# Patient Record
Sex: Female | Born: 1945 | Race: Black or African American | Hispanic: No | Smoking: Former smoker
Health system: Southern US, Community
[De-identification: ages and names within clinical notes are randomized; demographics above are authoritative.]

## PROBLEM LIST (undated history)

## (undated) DIAGNOSIS — I10 Essential (primary) hypertension: Secondary | ICD-10-CM

## (undated) DIAGNOSIS — G8929 Other chronic pain: Secondary | ICD-10-CM

## (undated) DIAGNOSIS — H409 Unspecified glaucoma: Secondary | ICD-10-CM

## (undated) DIAGNOSIS — M7060 Trochanteric bursitis, unspecified hip: Secondary | ICD-10-CM

## (undated) DIAGNOSIS — R519 Headache, unspecified: Secondary | ICD-10-CM

## (undated) DIAGNOSIS — R51 Headache: Secondary | ICD-10-CM

## (undated) DIAGNOSIS — K219 Gastro-esophageal reflux disease without esophagitis: Secondary | ICD-10-CM

## (undated) HISTORY — DX: Essential (primary) hypertension: I10

## (undated) HISTORY — DX: Trochanteric bursitis, unspecified hip: M70.60

## (undated) HISTORY — DX: Unspecified glaucoma: H40.9

## (undated) HISTORY — DX: Headache: R51

## (undated) HISTORY — DX: Gastro-esophageal reflux disease without esophagitis: K21.9

## (undated) HISTORY — DX: Other chronic pain: G89.29

## (undated) HISTORY — DX: Headache, unspecified: R51.9

---

## 2012-12-03 DIAGNOSIS — R079 Chest pain, unspecified: Secondary | ICD-10-CM

## 2013-03-02 ENCOUNTER — Other Ambulatory Visit: Payer: Self-pay | Admitting: *Deleted

## 2013-03-02 DIAGNOSIS — R609 Edema, unspecified: Secondary | ICD-10-CM

## 2013-03-03 ENCOUNTER — Encounter: Payer: Self-pay | Admitting: Vascular Surgery

## 2013-04-22 ENCOUNTER — Encounter: Payer: Self-pay | Admitting: Vascular Surgery

## 2013-04-23 ENCOUNTER — Encounter: Payer: Self-pay | Admitting: Vascular Surgery

## 2013-04-23 ENCOUNTER — Ambulatory Visit (INDEPENDENT_AMBULATORY_CARE_PROVIDER_SITE_OTHER): Payer: PRIVATE HEALTH INSURANCE | Admitting: Vascular Surgery

## 2013-04-23 ENCOUNTER — Encounter (INDEPENDENT_AMBULATORY_CARE_PROVIDER_SITE_OTHER): Payer: PRIVATE HEALTH INSURANCE | Admitting: *Deleted

## 2013-04-23 VITALS — BP 162/80 | HR 68 | Resp 16 | Ht 72.0 in | Wt 202.0 lb

## 2013-04-23 DIAGNOSIS — R609 Edema, unspecified: Secondary | ICD-10-CM

## 2013-04-23 DIAGNOSIS — I872 Venous insufficiency (chronic) (peripheral): Secondary | ICD-10-CM

## 2013-04-23 NOTE — Progress Notes (Signed)
[facility] [place]  Referred by:  Dr. Sherryll Burger  Reason for referral: Swollen bilateral legs  History of Present Illness  Michaela Moore is a 67 y.o. (10-05-1946) female who presents with chief complaint: swollen leg.  Patient notes, onset of swelling 3 months ago, associated with no obvious trigger.  Upon further questioning, pt does not prior swelling years ago.  The patient's symptoms include: bilateral leg swelling with itching.  The patient has had no history of DVT, history of pregnancy, known history of varicose vein, no history of venous stasis ulcers, no history of Lymphedema and known history of skin changes in lower legs.  There is known family history of venous disorders.  The patient has used OTC compression stockings in the past.  Past Medical History  Diagnosis Date  . Hypertension   . Esophageal reflux   . Chronic headaches   . Trochanteric tendinitis   . Glaucoma    PSH Denies any prior procedure  History   Social History  . Marital Status: Widowed    Spouse Name: N/A    Number of Children: N/A  . Years of Education: N/A   Occupational History  . Not on file.   Social History Main Topics  . Smoking status: Former Smoker    Quit date: 10/08/1987  . Smokeless tobacco: Never Used  . Alcohol Use: No  . Drug Use: No  . Sexually Active: Not on file   Other Topics Concern  . Not on file   Social History Narrative  . No narrative on file   Family History  Problem Relation Age of Onset  . Cancer Father   . Hyperlipidemia Sister   . Hypertension Sister   . Heart attack Brother   . COPD Sister   . Hypertension Sister    Current Outpatient Prescriptions on File Prior to Visit  Medication Sig Dispense Refill  . amLODipine (NORVASC) 10 MG tablet Take 10 mg by mouth daily.      Marland Kitchen aspirin 81 MG tablet Take 81 mg by mouth daily.      . benazepril (LOTENSIN) 20 MG tablet Take 20 mg by mouth daily.      . cyclobenzaprine (FLEXERIL) 10 MG  tablet Take 10 mg by mouth 3 (three) times daily as needed for muscle spasms.      . calcium carbonate (TUMS - DOSED IN MG ELEMENTAL CALCIUM) 500 MG chewable tablet Chew 1 tablet by mouth daily.      . Cholecalciferol (VITAMIN D) 400 UNITS capsule Take 400 Units by mouth daily.      . fexofenadine (ALLEGRA) 180 MG tablet Take 180 mg by mouth daily.      . vitamin C (ASCORBIC ACID) 500 MG tablet Take 500 mg by mouth daily.       No current facility-administered medications on file prior to visit.   Allergies  Allergen Reactions  . Codeine Nausea Only    REVIEW OF SYSTEMS:  (Positives checked otherwise negative)  CARDIOVASCULAR:  []  chest pain, []  chest pressure, []  palpitations, []  shortness of breath when laying flat, []  shortness of breath with exertion,  []  pain in feet when walking, []  pain in feet when laying flat, []  history of blood clot in veins (DVT), []  history of phlebitis, [x]  swelling in legs, [x]  varicose veins  PULMONARY:  []  productive cough, []  asthma, []  wheezing  NEUROLOGIC:  []  weakness in arms or legs, []  numbness in arms or legs, []  difficulty speaking or  slurred speech, []  temporary loss of vision in one eye, []  dizziness  HEMATOLOGIC:  []  bleeding problems, []  problems with blood clotting too easily  MUSCULOSKEL:  []  joint pain, []  joint swelling  GASTROINTEST:  []  vomiting blood, []  blood in stool     GENITOURINARY:  []  burning with urination, []  blood in urine  PSYCHIATRIC:  []  history of major depression  INTEGUMENTARY:  []  rashes, []  ulcers  CONSTITUTIONAL:  []  fever, []  chills  For VQI Use Only  PRE-ADM LIVING: Home  AMB STATUS: Ambulatory  CAD Sx: None  PRIOR CHF: None  STRESS TEST: [x]  No, [ ]  Normal, [ ]  + ischemia, [ ]  + MI, [ ]  Both  Physical Examination Filed Vitals:   04/23/13 1011  BP: 162/80  Pulse: 68  Resp: 16  Height: 6' (1.829 m)  Weight: 202 lb (91.627 kg)  SpO2: 99%   Body mass index is 27.39 kg/(m^2).  General: A&O  x 3, WDWN  Head: [place]/AT  Ear/Nose/Throat: Hearing grossly intact, nares w/o erythema or drainage, oropharynx w/o Erythema/Exudate  Eyes: PERRLA, EOMI  Neck: Supple, no nuchal rigidity, no palpable LAD  Pulmonary: Sym exp, good air movt, CTAB, no rales, rhonchi, & wheezing  Cardiac: RRR, Nl S1, S2, no Murmurs, rubs or gallops  Vascular: Vessel Right Left  Radial Palpable Palpable  Brachial Palpable Palpable  Carotid Palpable, without bruit Palpable, without bruit  Aorta Not palpable N/A  Femoral Palpable Palpable  Popliteal Not palpable Not palpable  PT Palpable Palpable  DP Palpable Palpable   Gastrointestinal: soft, NTND, -G/R, - HSM, - masses, - CVAT B  Musculoskeletal: M/S 5/5 throughout , Extremities without ischemic changes   Neurologic: CN 2-12 intact , Pain and light touch intact in extremities , Motor exam as listed above  Psychiatric: Judgment intact, Mood & affect appropriate for pt's clinical situation  Dermatologic: See M/S exam for extremity exam, no rashes otherwise noted  Lymph : No Cervical, Axillary, or Inguinal lymphadenopathy   Non-Invasive Vascular Imaging  BLE Venous Insufficiency Duplex (Date: 04/23/2013):   RLE: no DVT and SVT, no GSV reflux, + deep venous reflux, +R SSV reflux  LLE: no DVT and SVT, no GSV reflux, + deep venous reflux  Outside Studies/Documentation 4 pages of outside documents were reviewed including: clinic chart.  Medical Decision Making  Michaela Moore is a 67 y.o. female who presents with: chronic venous insufficiency (C2).   Based on the patient's history and examination, I recommend: B thigh high compression stockings 20-30 mm Hg.  The patient will follow up as needed for additional evaluation.  Thank you for allowing [place] to participate in this patient's care.  Leonides Sake, MD [facility] [place] Office: [phone] Pager: [phone]  04/23/2013, 10:53 AM

## 2015-10-23 DIAGNOSIS — Z6830 Body mass index (BMI) 30.0-30.9, adult: Secondary | ICD-10-CM | POA: Diagnosis not present

## 2015-10-23 DIAGNOSIS — R51 Headache: Secondary | ICD-10-CM | POA: Diagnosis not present

## 2015-10-23 DIAGNOSIS — Z789 Other specified health status: Secondary | ICD-10-CM | POA: Diagnosis not present

## 2015-11-21 DIAGNOSIS — I10 Essential (primary) hypertension: Secondary | ICD-10-CM | POA: Diagnosis not present

## 2016-05-27 DIAGNOSIS — Z79899 Other long term (current) drug therapy: Secondary | ICD-10-CM | POA: Diagnosis not present

## 2016-05-27 DIAGNOSIS — Z299 Encounter for prophylactic measures, unspecified: Secondary | ICD-10-CM | POA: Diagnosis not present

## 2016-05-27 DIAGNOSIS — Z0000 Encounter for general adult medical examination without abnormal findings: Secondary | ICD-10-CM | POA: Diagnosis not present

## 2016-05-27 DIAGNOSIS — E559 Vitamin D deficiency, unspecified: Secondary | ICD-10-CM | POA: Diagnosis not present

## 2016-05-27 DIAGNOSIS — I10 Essential (primary) hypertension: Secondary | ICD-10-CM | POA: Diagnosis not present

## 2016-05-27 DIAGNOSIS — R5383 Other fatigue: Secondary | ICD-10-CM | POA: Diagnosis not present

## 2016-05-27 DIAGNOSIS — Z01419 Encounter for gynecological examination (general) (routine) without abnormal findings: Secondary | ICD-10-CM | POA: Diagnosis not present

## 2016-05-27 DIAGNOSIS — Z1389 Encounter for screening for other disorder: Secondary | ICD-10-CM | POA: Diagnosis not present

## 2016-05-27 DIAGNOSIS — Z1211 Encounter for screening for malignant neoplasm of colon: Secondary | ICD-10-CM | POA: Diagnosis not present

## 2016-05-27 DIAGNOSIS — Z7189 Other specified counseling: Secondary | ICD-10-CM | POA: Diagnosis not present

## 2016-07-17 DIAGNOSIS — S161XXA Strain of muscle, fascia and tendon at neck level, initial encounter: Secondary | ICD-10-CM | POA: Diagnosis not present

## 2016-07-19 ENCOUNTER — Emergency Department (HOSPITAL_COMMUNITY)
Admission: EM | Admit: 2016-07-19 | Discharge: 2016-07-19 | Disposition: A | Discharge status: Home / Self Care | Payer: Medicare Other | Attending: Emergency Medicine | Admitting: Emergency Medicine

## 2016-07-19 ENCOUNTER — Encounter (HOSPITAL_COMMUNITY): Payer: Self-pay | Admitting: Emergency Medicine

## 2016-07-19 DIAGNOSIS — Z299 Encounter for prophylactic measures, unspecified: Secondary | ICD-10-CM | POA: Diagnosis not present

## 2016-07-19 DIAGNOSIS — S060X0D Concussion without loss of consciousness, subsequent encounter: Secondary | ICD-10-CM

## 2016-07-19 DIAGNOSIS — Z87891 Personal history of nicotine dependence: Secondary | ICD-10-CM | POA: Insufficient documentation

## 2016-07-19 DIAGNOSIS — Z79899 Other long term (current) drug therapy: Secondary | ICD-10-CM | POA: Diagnosis not present

## 2016-07-19 DIAGNOSIS — Z7982 Long term (current) use of aspirin: Secondary | ICD-10-CM | POA: Diagnosis not present

## 2016-07-19 DIAGNOSIS — Z6829 Body mass index (BMI) 29.0-29.9, adult: Secondary | ICD-10-CM | POA: Diagnosis not present

## 2016-07-19 DIAGNOSIS — S0990XD Unspecified injury of head, subsequent encounter: Secondary | ICD-10-CM | POA: Diagnosis present

## 2016-07-19 DIAGNOSIS — I10 Essential (primary) hypertension: Secondary | ICD-10-CM | POA: Insufficient documentation

## 2016-07-19 DIAGNOSIS — M791 Myalgia: Secondary | ICD-10-CM | POA: Diagnosis not present

## 2016-07-19 DIAGNOSIS — R51 Headache: Secondary | ICD-10-CM | POA: Diagnosis not present

## 2016-07-19 DIAGNOSIS — Z713 Dietary counseling and surveillance: Secondary | ICD-10-CM | POA: Diagnosis not present
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
## 2016-09-09 ENCOUNTER — Other Ambulatory Visit (HOSPITAL_COMMUNITY): Payer: Self-pay | Admitting: Nurse Practitioner

## 2016-09-09 ENCOUNTER — Ambulatory Visit (HOSPITAL_COMMUNITY)
Admission: RE | Admit: 2016-09-09 | Discharge: 2016-09-09 | Disposition: A | Discharge status: Home / Self Care | Payer: Medicare Other | Source: Ambulatory Visit | Attending: Nurse Practitioner | Admitting: Nurse Practitioner

## 2016-09-09 DIAGNOSIS — M7122 Synovial cyst of popliteal space [Baker], left knee: Secondary | ICD-10-CM | POA: Diagnosis not present

## 2016-09-09 DIAGNOSIS — M79605 Pain in left leg: Secondary | ICD-10-CM

## 2016-09-09 DIAGNOSIS — R600 Localized edema: Secondary | ICD-10-CM | POA: Diagnosis not present

## 2016-09-09 DIAGNOSIS — M7989 Other specified soft tissue disorders: Secondary | ICD-10-CM | POA: Insufficient documentation

## 2016-09-09 DIAGNOSIS — Z299 Encounter for prophylactic measures, unspecified: Secondary | ICD-10-CM | POA: Diagnosis not present

## 2016-09-09 DIAGNOSIS — Z713 Dietary counseling and surveillance: Secondary | ICD-10-CM | POA: Diagnosis not present

## 2016-09-09 DIAGNOSIS — Z6830 Body mass index (BMI) 30.0-30.9, adult: Secondary | ICD-10-CM | POA: Diagnosis not present

## 2016-09-09 DIAGNOSIS — M79662 Pain in left lower leg: Secondary | ICD-10-CM | POA: Diagnosis not present

## 2016-09-09 DIAGNOSIS — I10 Essential (primary) hypertension: Secondary | ICD-10-CM | POA: Diagnosis not present

## 2016-11-15 DIAGNOSIS — R040 Epistaxis: Secondary | ICD-10-CM | POA: Diagnosis not present

## 2016-11-15 DIAGNOSIS — Z6829 Body mass index (BMI) 29.0-29.9, adult: Secondary | ICD-10-CM | POA: Diagnosis not present

## 2016-11-15 DIAGNOSIS — Z789 Other specified health status: Secondary | ICD-10-CM | POA: Diagnosis not present

## 2016-11-15 DIAGNOSIS — K219 Gastro-esophageal reflux disease without esophagitis: Secondary | ICD-10-CM | POA: Diagnosis not present

## 2016-11-15 DIAGNOSIS — Z713 Dietary counseling and surveillance: Secondary | ICD-10-CM | POA: Diagnosis not present

## 2016-11-15 DIAGNOSIS — I10 Essential (primary) hypertension: Secondary | ICD-10-CM | POA: Diagnosis not present

## 2016-11-15 DIAGNOSIS — B354 Tinea corporis: Secondary | ICD-10-CM | POA: Diagnosis not present

## 2016-11-15 DIAGNOSIS — Z299 Encounter for prophylactic measures, unspecified: Secondary | ICD-10-CM | POA: Diagnosis not present

## 2016-12-05 DIAGNOSIS — K219 Gastro-esophageal reflux disease without esophagitis: Secondary | ICD-10-CM | POA: Diagnosis not present

## 2016-12-05 DIAGNOSIS — Z6830 Body mass index (BMI) 30.0-30.9, adult: Secondary | ICD-10-CM | POA: Diagnosis not present

## 2016-12-05 DIAGNOSIS — I10 Essential (primary) hypertension: Secondary | ICD-10-CM | POA: Diagnosis not present

## 2016-12-05 DIAGNOSIS — Z713 Dietary counseling and surveillance: Secondary | ICD-10-CM | POA: Diagnosis not present

## 2016-12-05 DIAGNOSIS — R51 Headache: Secondary | ICD-10-CM | POA: Diagnosis not present

## 2016-12-05 DIAGNOSIS — Z299 Encounter for prophylactic measures, unspecified: Secondary | ICD-10-CM | POA: Diagnosis not present

## 2017-03-11 DIAGNOSIS — Z972 Presence of dental prosthetic device (complete) (partial): Secondary | ICD-10-CM | POA: Diagnosis not present

## 2017-03-11 DIAGNOSIS — Z79899 Other long term (current) drug therapy: Secondary | ICD-10-CM | POA: Diagnosis not present

## 2017-03-11 DIAGNOSIS — Z7982 Long term (current) use of aspirin: Secondary | ICD-10-CM | POA: Diagnosis not present

## 2017-03-11 DIAGNOSIS — K08409 Partial loss of teeth, unspecified cause, unspecified class: Secondary | ICD-10-CM | POA: Diagnosis not present

## 2017-03-11 DIAGNOSIS — S161XXD Strain of muscle, fascia and tendon at neck level, subsequent encounter: Secondary | ICD-10-CM | POA: Diagnosis not present

## 2017-03-11 DIAGNOSIS — I10 Essential (primary) hypertension: Secondary | ICD-10-CM | POA: Diagnosis not present

## 2017-03-11 DIAGNOSIS — R600 Localized edema: Secondary | ICD-10-CM | POA: Diagnosis not present

## 2017-03-11 DIAGNOSIS — Z6829 Body mass index (BMI) 29.0-29.9, adult: Secondary | ICD-10-CM | POA: Diagnosis not present

## 2017-03-11 DIAGNOSIS — Z0000 Encounter for general adult medical examination without abnormal findings: Secondary | ICD-10-CM | POA: Diagnosis not present

## 2017-03-21 DIAGNOSIS — I10 Essential (primary) hypertension: Secondary | ICD-10-CM | POA: Diagnosis not present

## 2017-03-21 DIAGNOSIS — M549 Dorsalgia, unspecified: Secondary | ICD-10-CM | POA: Diagnosis not present

## 2017-03-21 DIAGNOSIS — Z713 Dietary counseling and surveillance: Secondary | ICD-10-CM | POA: Diagnosis not present

## 2017-03-21 DIAGNOSIS — K219 Gastro-esophageal reflux disease without esophagitis: Secondary | ICD-10-CM | POA: Diagnosis not present

## 2017-03-21 DIAGNOSIS — Z299 Encounter for prophylactic measures, unspecified: Secondary | ICD-10-CM | POA: Diagnosis not present

## 2017-03-21 DIAGNOSIS — Z6829 Body mass index (BMI) 29.0-29.9, adult: Secondary | ICD-10-CM | POA: Diagnosis not present

## 2017-06-02 DIAGNOSIS — Z79899 Other long term (current) drug therapy: Secondary | ICD-10-CM | POA: Diagnosis not present

## 2017-06-02 DIAGNOSIS — Z1389 Encounter for screening for other disorder: Secondary | ICD-10-CM | POA: Diagnosis not present

## 2017-06-02 DIAGNOSIS — Z7189 Other specified counseling: Secondary | ICD-10-CM | POA: Diagnosis not present

## 2017-06-02 DIAGNOSIS — Z1211 Encounter for screening for malignant neoplasm of colon: Secondary | ICD-10-CM | POA: Diagnosis not present

## 2017-06-02 DIAGNOSIS — Z0000 Encounter for general adult medical examination without abnormal findings: Secondary | ICD-10-CM | POA: Diagnosis not present

## 2017-06-02 DIAGNOSIS — R011 Cardiac murmur, unspecified: Secondary | ICD-10-CM | POA: Diagnosis not present

## 2017-06-02 DIAGNOSIS — Z299 Encounter for prophylactic measures, unspecified: Secondary | ICD-10-CM | POA: Diagnosis not present

## 2017-06-02 DIAGNOSIS — Z6830 Body mass index (BMI) 30.0-30.9, adult: Secondary | ICD-10-CM | POA: Diagnosis not present

## 2017-06-02 DIAGNOSIS — E559 Vitamin D deficiency, unspecified: Secondary | ICD-10-CM | POA: Diagnosis not present

## 2017-06-02 DIAGNOSIS — Z1231 Encounter for screening mammogram for malignant neoplasm of breast: Secondary | ICD-10-CM | POA: Diagnosis not present

## 2017-06-03 DIAGNOSIS — I10 Essential (primary) hypertension: Secondary | ICD-10-CM | POA: Diagnosis not present

## 2017-06-03 DIAGNOSIS — Z79899 Other long term (current) drug therapy: Secondary | ICD-10-CM | POA: Diagnosis not present

## 2017-06-03 DIAGNOSIS — R5383 Other fatigue: Secondary | ICD-10-CM | POA: Diagnosis not present

## 2017-06-03 DIAGNOSIS — Z0000 Encounter for general adult medical examination without abnormal findings: Secondary | ICD-10-CM | POA: Diagnosis not present

## 2017-06-03 DIAGNOSIS — E559 Vitamin D deficiency, unspecified: Secondary | ICD-10-CM | POA: Diagnosis not present

## 2017-06-16 DIAGNOSIS — R010 Benign and innocent cardiac murmurs: Secondary | ICD-10-CM | POA: Diagnosis not present

## 2017-06-20 DIAGNOSIS — Z299 Encounter for prophylactic measures, unspecified: Secondary | ICD-10-CM | POA: Diagnosis not present

## 2017-06-20 DIAGNOSIS — Z6830 Body mass index (BMI) 30.0-30.9, adult: Secondary | ICD-10-CM | POA: Diagnosis not present

## 2017-06-20 DIAGNOSIS — I10 Essential (primary) hypertension: Secondary | ICD-10-CM | POA: Diagnosis not present

## 2017-06-20 DIAGNOSIS — K219 Gastro-esophageal reflux disease without esophagitis: Secondary | ICD-10-CM | POA: Diagnosis not present

## 2017-06-20 DIAGNOSIS — R0789 Other chest pain: Secondary | ICD-10-CM | POA: Diagnosis not present

## 2017-06-20 DIAGNOSIS — M940 Chondrocostal junction syndrome [Tietze]: Secondary | ICD-10-CM | POA: Diagnosis not present

## 2017-06-24 DIAGNOSIS — E2839 Other primary ovarian failure: Secondary | ICD-10-CM | POA: Diagnosis not present

## 2017-07-29 DIAGNOSIS — Z1231 Encounter for screening mammogram for malignant neoplasm of breast: Secondary | ICD-10-CM | POA: Diagnosis not present

## 2017-11-04 DIAGNOSIS — Z789 Other specified health status: Secondary | ICD-10-CM | POA: Diagnosis not present

## 2017-11-04 DIAGNOSIS — M1711 Unilateral primary osteoarthritis, right knee: Secondary | ICD-10-CM | POA: Diagnosis not present

## 2017-11-04 DIAGNOSIS — Z299 Encounter for prophylactic measures, unspecified: Secondary | ICD-10-CM | POA: Diagnosis not present

## 2017-11-04 DIAGNOSIS — M25561 Pain in right knee: Secondary | ICD-10-CM | POA: Diagnosis not present

## 2017-11-04 DIAGNOSIS — Z6832 Body mass index (BMI) 32.0-32.9, adult: Secondary | ICD-10-CM | POA: Diagnosis not present

## 2017-11-04 DIAGNOSIS — I10 Essential (primary) hypertension: Secondary | ICD-10-CM | POA: Diagnosis not present

## 2018-01-26 DIAGNOSIS — Z791 Long term (current) use of non-steroidal anti-inflammatories (NSAID): Secondary | ICD-10-CM | POA: Diagnosis not present

## 2018-01-26 DIAGNOSIS — R69 Illness, unspecified: Secondary | ICD-10-CM | POA: Diagnosis not present

## 2018-01-26 DIAGNOSIS — J309 Allergic rhinitis, unspecified: Secondary | ICD-10-CM | POA: Diagnosis not present

## 2018-01-26 DIAGNOSIS — G8929 Other chronic pain: Secondary | ICD-10-CM | POA: Diagnosis not present

## 2018-01-26 DIAGNOSIS — E669 Obesity, unspecified: Secondary | ICD-10-CM | POA: Diagnosis not present

## 2018-01-26 DIAGNOSIS — Z6830 Body mass index (BMI) 30.0-30.9, adult: Secondary | ICD-10-CM | POA: Diagnosis not present

## 2018-01-26 DIAGNOSIS — H409 Unspecified glaucoma: Secondary | ICD-10-CM | POA: Diagnosis not present

## 2018-01-26 DIAGNOSIS — H269 Unspecified cataract: Secondary | ICD-10-CM | POA: Diagnosis not present

## 2018-01-26 DIAGNOSIS — I10 Essential (primary) hypertension: Secondary | ICD-10-CM | POA: Diagnosis not present

## 2018-01-26 DIAGNOSIS — K08409 Partial loss of teeth, unspecified cause, unspecified class: Secondary | ICD-10-CM | POA: Diagnosis not present

## 2018-02-19 DIAGNOSIS — H35463 Secondary vitreoretinal degeneration, bilateral: Secondary | ICD-10-CM | POA: Diagnosis not present

## 2018-02-19 DIAGNOSIS — H524 Presbyopia: Secondary | ICD-10-CM | POA: Diagnosis not present

## 2018-03-12 DIAGNOSIS — Z6831 Body mass index (BMI) 31.0-31.9, adult: Secondary | ICD-10-CM | POA: Diagnosis not present

## 2018-03-12 DIAGNOSIS — Z299 Encounter for prophylactic measures, unspecified: Secondary | ICD-10-CM | POA: Diagnosis not present

## 2018-03-12 DIAGNOSIS — M7040 Prepatellar bursitis, unspecified knee: Secondary | ICD-10-CM | POA: Diagnosis not present

## 2018-03-12 DIAGNOSIS — I10 Essential (primary) hypertension: Secondary | ICD-10-CM | POA: Diagnosis not present

## 2018-03-12 DIAGNOSIS — M1710 Unilateral primary osteoarthritis, unspecified knee: Secondary | ICD-10-CM | POA: Diagnosis not present

## 2018-04-03 DIAGNOSIS — H25013 Cortical age-related cataract, bilateral: Secondary | ICD-10-CM | POA: Diagnosis not present

## 2018-04-03 DIAGNOSIS — H2513 Age-related nuclear cataract, bilateral: Secondary | ICD-10-CM | POA: Diagnosis not present

## 2018-04-03 DIAGNOSIS — H401131 Primary open-angle glaucoma, bilateral, mild stage: Secondary | ICD-10-CM | POA: Diagnosis not present

## 2018-04-03 DIAGNOSIS — H5010 Unspecified exotropia: Secondary | ICD-10-CM | POA: Diagnosis not present

## 2018-05-11 DIAGNOSIS — H401131 Primary open-angle glaucoma, bilateral, mild stage: Secondary | ICD-10-CM | POA: Diagnosis not present

## 2018-06-04 DIAGNOSIS — R5383 Other fatigue: Secondary | ICD-10-CM | POA: Diagnosis not present

## 2018-06-04 DIAGNOSIS — Z6832 Body mass index (BMI) 32.0-32.9, adult: Secondary | ICD-10-CM | POA: Diagnosis not present

## 2018-06-04 DIAGNOSIS — E559 Vitamin D deficiency, unspecified: Secondary | ICD-10-CM | POA: Diagnosis not present

## 2018-06-04 DIAGNOSIS — Z299 Encounter for prophylactic measures, unspecified: Secondary | ICD-10-CM | POA: Diagnosis not present

## 2018-06-04 DIAGNOSIS — Z1331 Encounter for screening for depression: Secondary | ICD-10-CM | POA: Diagnosis not present

## 2018-06-04 DIAGNOSIS — Z0000 Encounter for general adult medical examination without abnormal findings: Secondary | ICD-10-CM | POA: Diagnosis not present

## 2018-06-04 DIAGNOSIS — I10 Essential (primary) hypertension: Secondary | ICD-10-CM | POA: Diagnosis not present

## 2018-06-04 DIAGNOSIS — Z7189 Other specified counseling: Secondary | ICD-10-CM | POA: Diagnosis not present

## 2018-06-04 DIAGNOSIS — Z79899 Other long term (current) drug therapy: Secondary | ICD-10-CM | POA: Diagnosis not present

## 2018-06-04 DIAGNOSIS — Z1339 Encounter for screening examination for other mental health and behavioral disorders: Secondary | ICD-10-CM | POA: Diagnosis not present

## 2018-06-04 DIAGNOSIS — Z1211 Encounter for screening for malignant neoplasm of colon: Secondary | ICD-10-CM | POA: Diagnosis not present

## 2018-08-24 DIAGNOSIS — J029 Acute pharyngitis, unspecified: Secondary | ICD-10-CM | POA: Diagnosis not present

## 2018-08-24 DIAGNOSIS — Z299 Encounter for prophylactic measures, unspecified: Secondary | ICD-10-CM | POA: Diagnosis not present

## 2018-08-24 DIAGNOSIS — Z6831 Body mass index (BMI) 31.0-31.9, adult: Secondary | ICD-10-CM | POA: Diagnosis not present

## 2018-08-24 DIAGNOSIS — K219 Gastro-esophageal reflux disease without esophagitis: Secondary | ICD-10-CM | POA: Diagnosis not present

## 2018-08-24 DIAGNOSIS — I10 Essential (primary) hypertension: Secondary | ICD-10-CM | POA: Diagnosis not present

## 2018-09-22 DIAGNOSIS — H401131 Primary open-angle glaucoma, bilateral, mild stage: Secondary | ICD-10-CM | POA: Diagnosis not present

## 2018-11-23 DIAGNOSIS — I10 Essential (primary) hypertension: Secondary | ICD-10-CM | POA: Diagnosis not present

## 2018-11-23 DIAGNOSIS — R6889 Other general symptoms and signs: Secondary | ICD-10-CM | POA: Diagnosis not present

## 2018-11-23 DIAGNOSIS — Z87891 Personal history of nicotine dependence: Secondary | ICD-10-CM | POA: Diagnosis not present

## 2018-11-23 DIAGNOSIS — Z299 Encounter for prophylactic measures, unspecified: Secondary | ICD-10-CM | POA: Diagnosis not present

## 2018-11-23 DIAGNOSIS — Z6832 Body mass index (BMI) 32.0-32.9, adult: Secondary | ICD-10-CM | POA: Diagnosis not present

## 2018-11-23 DIAGNOSIS — B354 Tinea corporis: Secondary | ICD-10-CM | POA: Diagnosis not present

## 2018-12-08 DIAGNOSIS — Z6832 Body mass index (BMI) 32.0-32.9, adult: Secondary | ICD-10-CM | POA: Diagnosis not present

## 2018-12-08 DIAGNOSIS — Z299 Encounter for prophylactic measures, unspecified: Secondary | ICD-10-CM | POA: Diagnosis not present

## 2018-12-08 DIAGNOSIS — M25579 Pain in unspecified ankle and joints of unspecified foot: Secondary | ICD-10-CM | POA: Diagnosis not present

## 2018-12-08 DIAGNOSIS — I10 Essential (primary) hypertension: Secondary | ICD-10-CM | POA: Diagnosis not present

## 2019-05-25 DIAGNOSIS — H401131 Primary open-angle glaucoma, bilateral, mild stage: Secondary | ICD-10-CM | POA: Diagnosis not present

## 2019-05-25 DIAGNOSIS — H469 Unspecified optic neuritis: Secondary | ICD-10-CM | POA: Diagnosis not present

## 2019-05-25 DIAGNOSIS — H2513 Age-related nuclear cataract, bilateral: Secondary | ICD-10-CM | POA: Diagnosis not present

## 2019-05-25 DIAGNOSIS — H25013 Cortical age-related cataract, bilateral: Secondary | ICD-10-CM | POA: Diagnosis not present

## 2019-06-10 DIAGNOSIS — Z6831 Body mass index (BMI) 31.0-31.9, adult: Secondary | ICD-10-CM | POA: Diagnosis not present

## 2019-06-10 DIAGNOSIS — M1710 Unilateral primary osteoarthritis, unspecified knee: Secondary | ICD-10-CM | POA: Diagnosis not present

## 2019-06-10 DIAGNOSIS — E559 Vitamin D deficiency, unspecified: Secondary | ICD-10-CM | POA: Diagnosis not present

## 2019-06-10 DIAGNOSIS — I10 Essential (primary) hypertension: Secondary | ICD-10-CM | POA: Diagnosis not present

## 2019-06-10 DIAGNOSIS — R5383 Other fatigue: Secondary | ICD-10-CM | POA: Diagnosis not present

## 2019-06-10 DIAGNOSIS — Z299 Encounter for prophylactic measures, unspecified: Secondary | ICD-10-CM | POA: Diagnosis not present

## 2019-06-10 DIAGNOSIS — Z79899 Other long term (current) drug therapy: Secondary | ICD-10-CM | POA: Diagnosis not present

## 2019-06-10 DIAGNOSIS — Z789 Other specified health status: Secondary | ICD-10-CM | POA: Diagnosis not present

## 2019-06-11 DIAGNOSIS — Z299 Encounter for prophylactic measures, unspecified: Secondary | ICD-10-CM | POA: Diagnosis not present

## 2019-06-11 DIAGNOSIS — Z1211 Encounter for screening for malignant neoplasm of colon: Secondary | ICD-10-CM | POA: Diagnosis not present

## 2019-06-11 DIAGNOSIS — Z1339 Encounter for screening examination for other mental health and behavioral disorders: Secondary | ICD-10-CM | POA: Diagnosis not present

## 2019-06-11 DIAGNOSIS — I10 Essential (primary) hypertension: Secondary | ICD-10-CM | POA: Diagnosis not present

## 2019-06-11 DIAGNOSIS — M1710 Unilateral primary osteoarthritis, unspecified knee: Secondary | ICD-10-CM | POA: Diagnosis not present

## 2019-06-11 DIAGNOSIS — Z6831 Body mass index (BMI) 31.0-31.9, adult: Secondary | ICD-10-CM | POA: Diagnosis not present

## 2019-06-11 DIAGNOSIS — Z7189 Other specified counseling: Secondary | ICD-10-CM | POA: Diagnosis not present

## 2019-06-11 DIAGNOSIS — Z1331 Encounter for screening for depression: Secondary | ICD-10-CM | POA: Diagnosis not present

## 2019-06-11 DIAGNOSIS — E8940 Asymptomatic postprocedural ovarian failure: Secondary | ICD-10-CM | POA: Diagnosis not present

## 2019-06-11 DIAGNOSIS — Z0000 Encounter for general adult medical examination without abnormal findings: Secondary | ICD-10-CM | POA: Diagnosis not present

## 2019-12-10 DIAGNOSIS — Z299 Encounter for prophylactic measures, unspecified: Secondary | ICD-10-CM | POA: Diagnosis not present

## 2019-12-10 DIAGNOSIS — Z6831 Body mass index (BMI) 31.0-31.9, adult: Secondary | ICD-10-CM | POA: Diagnosis not present

## 2019-12-10 DIAGNOSIS — K219 Gastro-esophageal reflux disease without esophagitis: Secondary | ICD-10-CM | POA: Diagnosis not present

## 2019-12-10 DIAGNOSIS — I10 Essential (primary) hypertension: Secondary | ICD-10-CM | POA: Diagnosis not present

## 2019-12-10 DIAGNOSIS — Z789 Other specified health status: Secondary | ICD-10-CM | POA: Diagnosis not present

## 2019-12-29 DIAGNOSIS — H53031 Strabismic amblyopia, right eye: Secondary | ICD-10-CM | POA: Diagnosis not present

## 2019-12-29 DIAGNOSIS — H401131 Primary open-angle glaucoma, bilateral, mild stage: Secondary | ICD-10-CM | POA: Diagnosis not present

## 2019-12-29 DIAGNOSIS — H469 Unspecified optic neuritis: Secondary | ICD-10-CM | POA: Diagnosis not present

## 2019-12-30 ENCOUNTER — Other Ambulatory Visit: Payer: Self-pay | Admitting: Surgery

## 2019-12-30 DIAGNOSIS — H469 Unspecified optic neuritis: Secondary | ICD-10-CM

## 2019-12-31 ENCOUNTER — Other Ambulatory Visit: Payer: Self-pay | Admitting: Surgery

## 2019-12-31 DIAGNOSIS — H469 Unspecified optic neuritis: Secondary | ICD-10-CM

## 2020-01-27 ENCOUNTER — Ambulatory Visit
Admission: RE | Admit: 2020-01-27 | Discharge: 2020-01-27 | Disposition: A | Discharge status: Home / Self Care | Payer: Medicare Other | Source: Ambulatory Visit | Attending: Surgery | Admitting: Surgery

## 2020-01-27 ENCOUNTER — Other Ambulatory Visit: Payer: Self-pay

## 2020-01-27 DIAGNOSIS — H469 Unspecified optic neuritis: Secondary | ICD-10-CM | POA: Diagnosis not present

## 2020-01-27 MED ORDER — GADOBENATE DIMEGLUMINE 529 MG/ML IV SOLN
20.0000 mL | Freq: Once | INTRAVENOUS | Status: AC | PRN
  Administered 2020-01-27: 20 mL via INTRAVENOUS

## 2020-04-02 DIAGNOSIS — R112 Nausea with vomiting, unspecified: Secondary | ICD-10-CM | POA: Diagnosis not present

## 2020-04-02 DIAGNOSIS — I10 Essential (primary) hypertension: Secondary | ICD-10-CM | POA: Diagnosis not present

## 2020-04-02 DIAGNOSIS — R1012 Left upper quadrant pain: Secondary | ICD-10-CM | POA: Diagnosis not present

## 2020-06-15 DIAGNOSIS — E559 Vitamin D deficiency, unspecified: Secondary | ICD-10-CM | POA: Diagnosis not present

## 2020-06-15 DIAGNOSIS — I10 Essential (primary) hypertension: Secondary | ICD-10-CM | POA: Diagnosis not present

## 2020-06-15 DIAGNOSIS — Z0000 Encounter for general adult medical examination without abnormal findings: Secondary | ICD-10-CM | POA: Diagnosis not present

## 2020-06-15 DIAGNOSIS — Z79899 Other long term (current) drug therapy: Secondary | ICD-10-CM | POA: Diagnosis not present

## 2020-06-15 DIAGNOSIS — Z6829 Body mass index (BMI) 29.0-29.9, adult: Secondary | ICD-10-CM | POA: Diagnosis not present

## 2020-06-15 DIAGNOSIS — Z299 Encounter for prophylactic measures, unspecified: Secondary | ICD-10-CM | POA: Diagnosis not present

## 2020-06-15 DIAGNOSIS — Z1331 Encounter for screening for depression: Secondary | ICD-10-CM | POA: Diagnosis not present

## 2020-06-15 DIAGNOSIS — R5383 Other fatigue: Secondary | ICD-10-CM | POA: Diagnosis not present

## 2020-06-15 DIAGNOSIS — Z7189 Other specified counseling: Secondary | ICD-10-CM | POA: Diagnosis not present

## 2020-06-15 DIAGNOSIS — Z1339 Encounter for screening examination for other mental health and behavioral disorders: Secondary | ICD-10-CM | POA: Diagnosis not present

## 2020-08-28 DIAGNOSIS — H409 Unspecified glaucoma: Secondary | ICD-10-CM | POA: Diagnosis not present

## 2020-08-28 DIAGNOSIS — R69 Illness, unspecified: Secondary | ICD-10-CM | POA: Diagnosis not present

## 2020-08-28 DIAGNOSIS — Z823 Family history of stroke: Secondary | ICD-10-CM | POA: Diagnosis not present

## 2020-08-28 DIAGNOSIS — I10 Essential (primary) hypertension: Secondary | ICD-10-CM | POA: Diagnosis not present

## 2020-08-28 DIAGNOSIS — M62838 Other muscle spasm: Secondary | ICD-10-CM | POA: Diagnosis not present

## 2020-08-28 DIAGNOSIS — Z803 Family history of malignant neoplasm of breast: Secondary | ICD-10-CM | POA: Diagnosis not present

## 2020-08-28 DIAGNOSIS — Z008 Encounter for other general examination: Secondary | ICD-10-CM | POA: Diagnosis not present

## 2020-08-28 DIAGNOSIS — M1990 Unspecified osteoarthritis, unspecified site: Secondary | ICD-10-CM | POA: Diagnosis not present

## 2020-08-28 DIAGNOSIS — Z8249 Family history of ischemic heart disease and other diseases of the circulatory system: Secondary | ICD-10-CM | POA: Diagnosis not present

## 2020-08-28 DIAGNOSIS — E669 Obesity, unspecified: Secondary | ICD-10-CM | POA: Diagnosis not present

## 2020-08-28 DIAGNOSIS — Z6830 Body mass index (BMI) 30.0-30.9, adult: Secondary | ICD-10-CM | POA: Diagnosis not present

## 2020-09-05 DIAGNOSIS — I10 Essential (primary) hypertension: Secondary | ICD-10-CM | POA: Diagnosis not present

## 2020-09-05 DIAGNOSIS — K219 Gastro-esophageal reflux disease without esophagitis: Secondary | ICD-10-CM | POA: Diagnosis not present

## 2020-09-21 DIAGNOSIS — Z1231 Encounter for screening mammogram for malignant neoplasm of breast: Secondary | ICD-10-CM | POA: Diagnosis not present

## 2020-10-06 DIAGNOSIS — I10 Essential (primary) hypertension: Secondary | ICD-10-CM | POA: Diagnosis not present

## 2020-10-06 DIAGNOSIS — K219 Gastro-esophageal reflux disease without esophagitis: Secondary | ICD-10-CM | POA: Diagnosis not present

## 2020-11-06 DIAGNOSIS — I10 Essential (primary) hypertension: Secondary | ICD-10-CM | POA: Diagnosis not present

## 2020-11-06 DIAGNOSIS — K219 Gastro-esophageal reflux disease without esophagitis: Secondary | ICD-10-CM | POA: Diagnosis not present

## 2020-12-04 DIAGNOSIS — K219 Gastro-esophageal reflux disease without esophagitis: Secondary | ICD-10-CM | POA: Diagnosis not present

## 2020-12-04 DIAGNOSIS — I10 Essential (primary) hypertension: Secondary | ICD-10-CM | POA: Diagnosis not present

## 2020-12-13 DIAGNOSIS — R159 Full incontinence of feces: Secondary | ICD-10-CM | POA: Diagnosis not present

## 2020-12-13 DIAGNOSIS — Z789 Other specified health status: Secondary | ICD-10-CM | POA: Diagnosis not present

## 2020-12-13 DIAGNOSIS — Z299 Encounter for prophylactic measures, unspecified: Secondary | ICD-10-CM | POA: Diagnosis not present

## 2020-12-13 DIAGNOSIS — I10 Essential (primary) hypertension: Secondary | ICD-10-CM | POA: Diagnosis not present

## 2020-12-13 DIAGNOSIS — Z6829 Body mass index (BMI) 29.0-29.9, adult: Secondary | ICD-10-CM | POA: Diagnosis not present

## 2021-01-03 DIAGNOSIS — K219 Gastro-esophageal reflux disease without esophagitis: Secondary | ICD-10-CM | POA: Diagnosis not present

## 2021-01-03 DIAGNOSIS — I10 Essential (primary) hypertension: Secondary | ICD-10-CM | POA: Diagnosis not present

## 2021-02-03 DIAGNOSIS — I10 Essential (primary) hypertension: Secondary | ICD-10-CM | POA: Diagnosis not present

## 2021-02-03 DIAGNOSIS — E7800 Pure hypercholesterolemia, unspecified: Secondary | ICD-10-CM | POA: Diagnosis not present

## 2021-02-03 DIAGNOSIS — K219 Gastro-esophageal reflux disease without esophagitis: Secondary | ICD-10-CM | POA: Diagnosis not present

## 2021-02-03 DIAGNOSIS — M545 Low back pain, unspecified: Secondary | ICD-10-CM | POA: Diagnosis not present

## 2021-04-05 DIAGNOSIS — E7800 Pure hypercholesterolemia, unspecified: Secondary | ICD-10-CM | POA: Diagnosis not present

## 2021-04-05 DIAGNOSIS — K2100 Gastro-esophageal reflux disease with esophagitis, without bleeding: Secondary | ICD-10-CM | POA: Diagnosis not present

## 2021-04-05 DIAGNOSIS — M545 Low back pain, unspecified: Secondary | ICD-10-CM | POA: Diagnosis not present

## 2021-04-05 DIAGNOSIS — I10 Essential (primary) hypertension: Secondary | ICD-10-CM | POA: Diagnosis not present

## 2021-04-19 DIAGNOSIS — H409 Unspecified glaucoma: Secondary | ICD-10-CM | POA: Diagnosis not present

## 2021-04-19 DIAGNOSIS — L309 Dermatitis, unspecified: Secondary | ICD-10-CM | POA: Diagnosis not present

## 2021-04-19 DIAGNOSIS — Z8249 Family history of ischemic heart disease and other diseases of the circulatory system: Secondary | ICD-10-CM | POA: Diagnosis not present

## 2021-04-19 DIAGNOSIS — I10 Essential (primary) hypertension: Secondary | ICD-10-CM | POA: Diagnosis not present

## 2021-04-19 DIAGNOSIS — M1990 Unspecified osteoarthritis, unspecified site: Secondary | ICD-10-CM | POA: Diagnosis not present

## 2021-04-19 DIAGNOSIS — Z825 Family history of asthma and other chronic lower respiratory diseases: Secondary | ICD-10-CM | POA: Diagnosis not present

## 2021-04-19 DIAGNOSIS — R69 Illness, unspecified: Secondary | ICD-10-CM | POA: Diagnosis not present

## 2021-04-19 DIAGNOSIS — Z803 Family history of malignant neoplasm of breast: Secondary | ICD-10-CM | POA: Diagnosis not present

## 2021-05-06 DIAGNOSIS — E7800 Pure hypercholesterolemia, unspecified: Secondary | ICD-10-CM | POA: Diagnosis not present

## 2021-05-06 DIAGNOSIS — K2100 Gastro-esophageal reflux disease with esophagitis, without bleeding: Secondary | ICD-10-CM | POA: Diagnosis not present

## 2021-05-06 DIAGNOSIS — I10 Essential (primary) hypertension: Secondary | ICD-10-CM | POA: Diagnosis not present

## 2021-05-06 DIAGNOSIS — M545 Low back pain, unspecified: Secondary | ICD-10-CM | POA: Diagnosis not present

## 2021-06-06 DIAGNOSIS — M545 Low back pain, unspecified: Secondary | ICD-10-CM | POA: Diagnosis not present

## 2021-06-06 DIAGNOSIS — K2100 Gastro-esophageal reflux disease with esophagitis, without bleeding: Secondary | ICD-10-CM | POA: Diagnosis not present

## 2021-06-06 DIAGNOSIS — E7800 Pure hypercholesterolemia, unspecified: Secondary | ICD-10-CM | POA: Diagnosis not present

## 2021-06-06 DIAGNOSIS — I10 Essential (primary) hypertension: Secondary | ICD-10-CM | POA: Diagnosis not present

## 2021-06-21 DIAGNOSIS — E559 Vitamin D deficiency, unspecified: Secondary | ICD-10-CM | POA: Diagnosis not present

## 2021-06-21 DIAGNOSIS — I10 Essential (primary) hypertension: Secondary | ICD-10-CM | POA: Diagnosis not present

## 2021-06-21 DIAGNOSIS — Z1331 Encounter for screening for depression: Secondary | ICD-10-CM | POA: Diagnosis not present

## 2021-06-21 DIAGNOSIS — E7800 Pure hypercholesterolemia, unspecified: Secondary | ICD-10-CM | POA: Diagnosis not present

## 2021-06-21 DIAGNOSIS — R5383 Other fatigue: Secondary | ICD-10-CM | POA: Diagnosis not present

## 2021-06-21 DIAGNOSIS — R011 Cardiac murmur, unspecified: Secondary | ICD-10-CM | POA: Diagnosis not present

## 2021-06-21 DIAGNOSIS — Z79899 Other long term (current) drug therapy: Secondary | ICD-10-CM | POA: Diagnosis not present

## 2021-06-21 DIAGNOSIS — Z299 Encounter for prophylactic measures, unspecified: Secondary | ICD-10-CM | POA: Diagnosis not present

## 2021-06-21 DIAGNOSIS — Z7189 Other specified counseling: Secondary | ICD-10-CM | POA: Diagnosis not present

## 2021-06-21 DIAGNOSIS — Z6830 Body mass index (BMI) 30.0-30.9, adult: Secondary | ICD-10-CM | POA: Diagnosis not present

## 2021-06-21 DIAGNOSIS — Z1339 Encounter for screening examination for other mental health and behavioral disorders: Secondary | ICD-10-CM | POA: Diagnosis not present

## 2021-06-21 DIAGNOSIS — Z0000 Encounter for general adult medical examination without abnormal findings: Secondary | ICD-10-CM | POA: Diagnosis not present

## 2021-06-25 DIAGNOSIS — R010 Benign and innocent cardiac murmurs: Secondary | ICD-10-CM | POA: Diagnosis not present

## 2021-07-09 DIAGNOSIS — E2839 Other primary ovarian failure: Secondary | ICD-10-CM | POA: Diagnosis not present

## 2021-07-11 IMAGING — MR MR ORBITS WO/W CM
4 of 6 series · 19 of 48 positions shown · IV contrast (multihance)
Comparison: Report from CT head/cervical spine 07/17/2016 (images
unavailable), head CT 08/14/2015

CLINICAL DATA: Optic neuritis, right. Additional history provided
by scanning technologist: Right eye "hard to focus."

EXAM:
MRI HEAD AND ORBITS WITHOUT AND WITH CONTRAST
TECHNIQUE: Multiplanar, multiecho pulse sequences of the brain and surrounding
structures were obtained without and with intravenous contrast.
Multiplanar, multiecho pulse sequences of the orbits and surrounding
structures were obtained including fat saturation techniques, before
and after intravenous contrast administration.
CONTRAST:  20mL MULTIHANCE GADOBENATE DIMEGLUMINE 529 MG/ML IV SOLN

[Series 15: T2 fat-sat · axial · 2.5mm · 0.25mm/px · z∈[-41,+22]mm · 4 of 28 slices shown (1 of 2)]
[im 1/28]
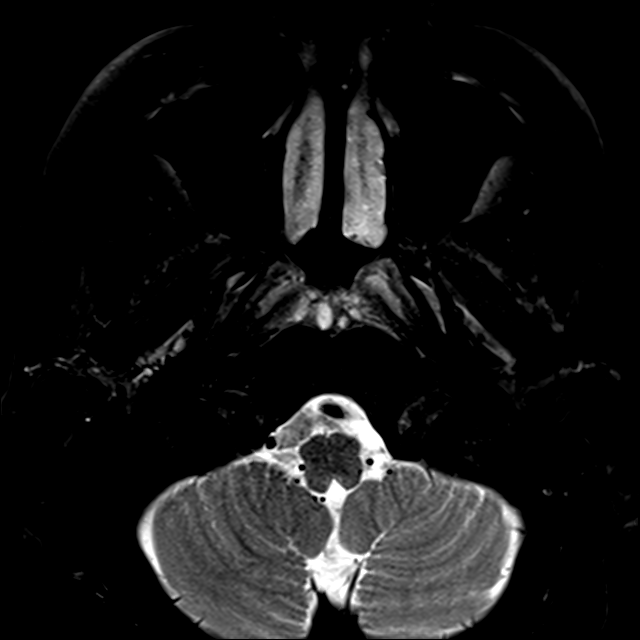
[im 4/28]
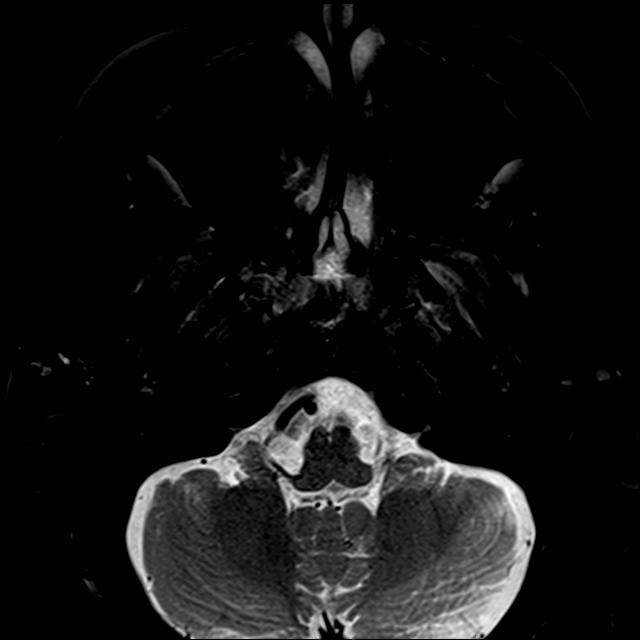
[im 14/28]
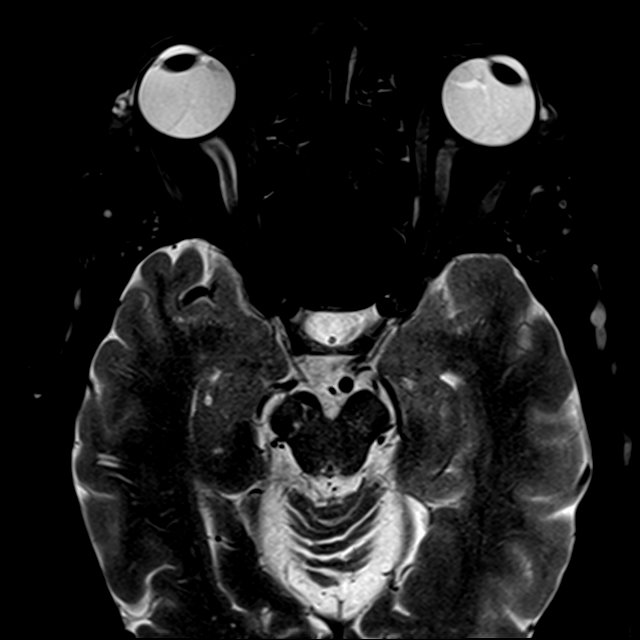
[im 24/28]
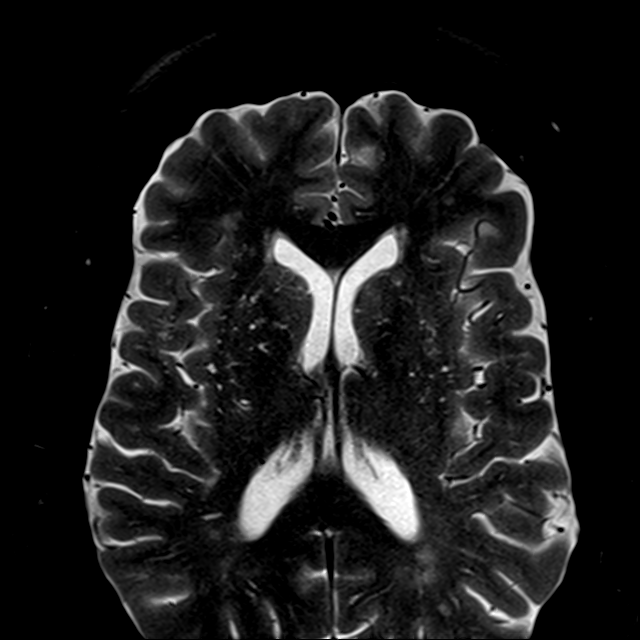

[Series 16: T2 fat-sat · coronal · 2.5mm · 0.25mm/px · 3 of 32 slices shown (2 of 2)]
[im 4/32]
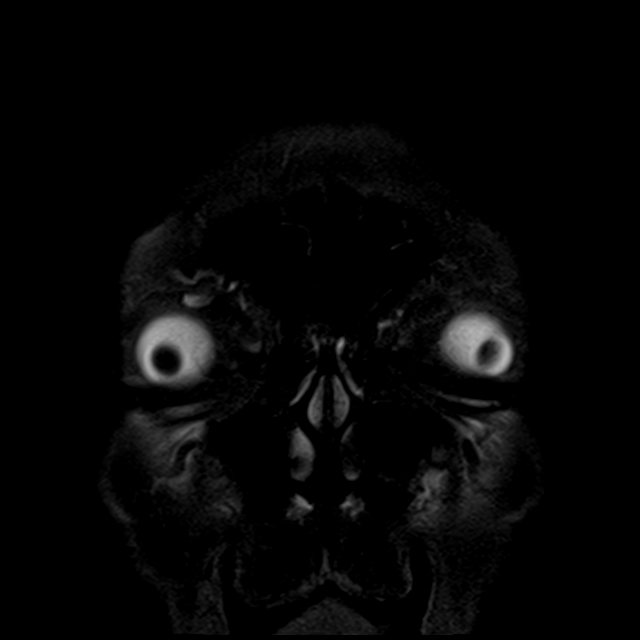
[im 16/32]
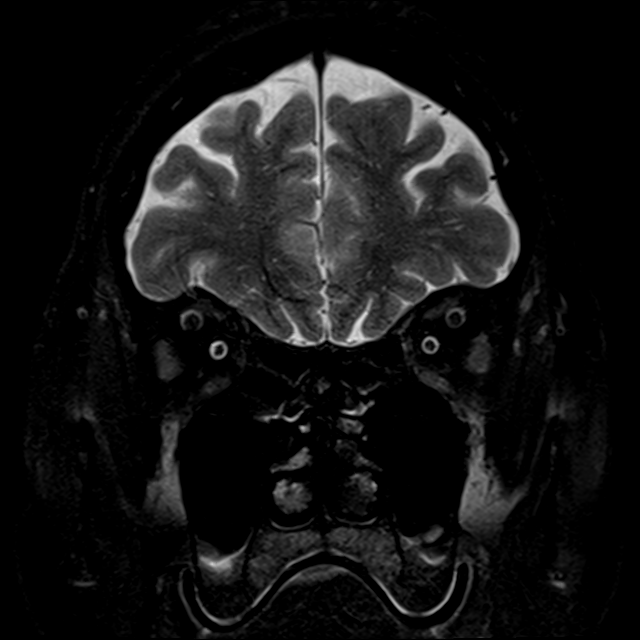
[im 28/32]
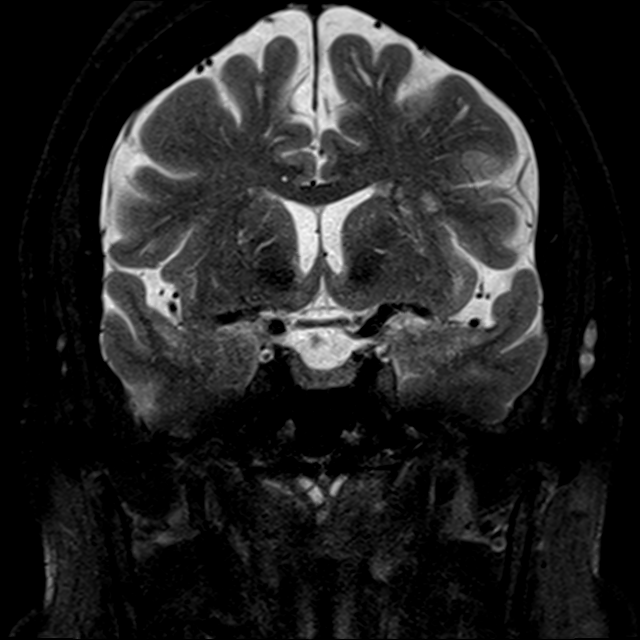

[Series 17: T1 · axial · 2.5mm · 0.25mm/px · z∈[-21,+21]mm · 3 of 19 slices shown]
[im 4/19]
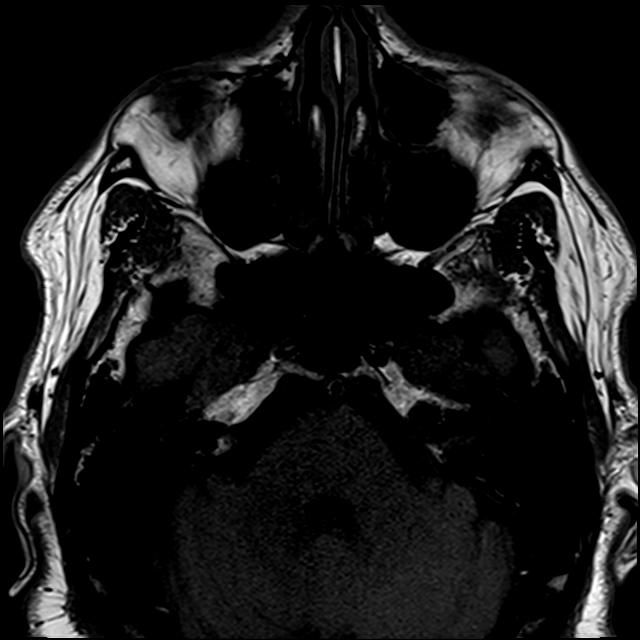
[im 11/19]
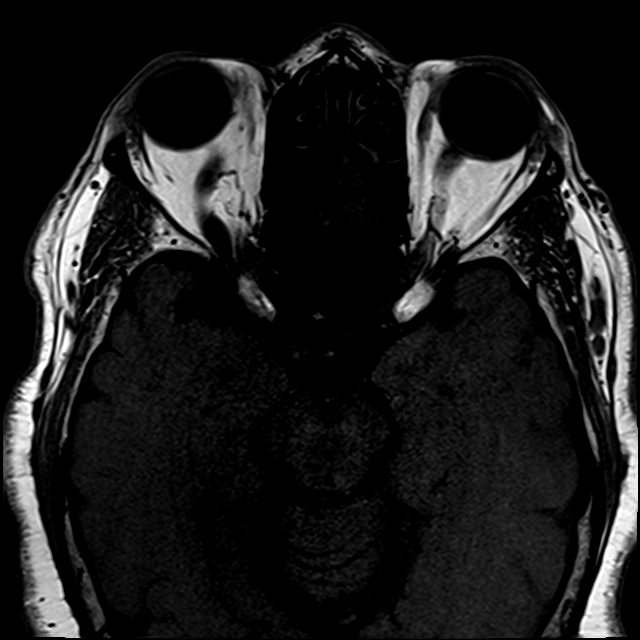
[im 19/19]
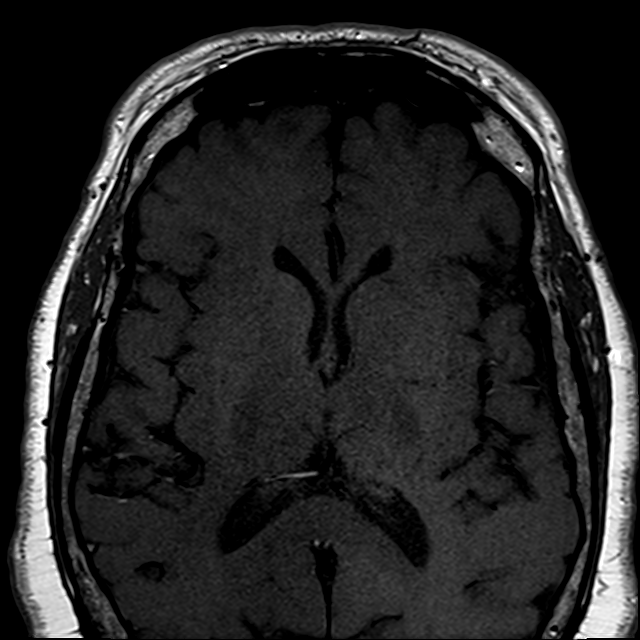

[Series 20: T1 post-contrast · coronal · 2.5mm · 0.50mm/px · 9 of 32 slices shown]
[im 1/32]
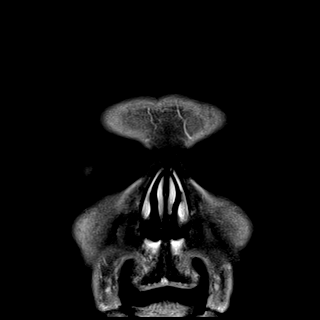
[im 4/32]
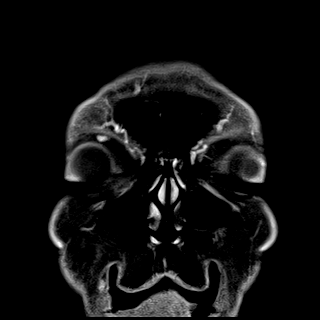
[im 8/32]
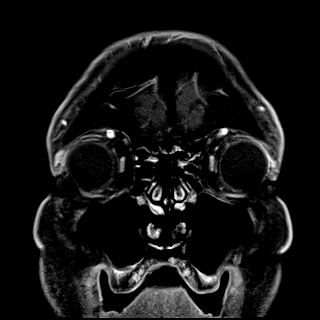
[im 12/32]
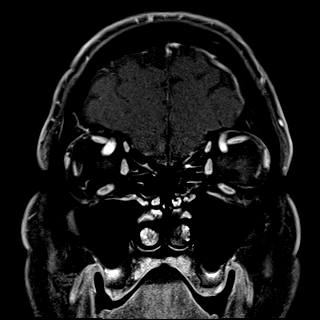
[im 16/32]
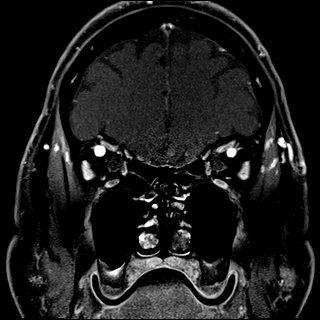
[im 20/32]
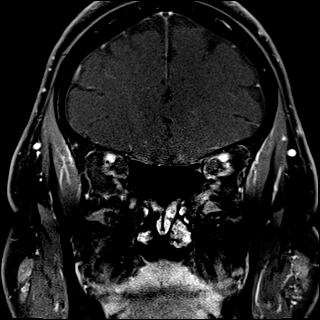
[im 24/32]
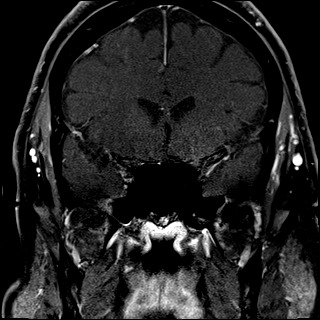
[im 28/32]
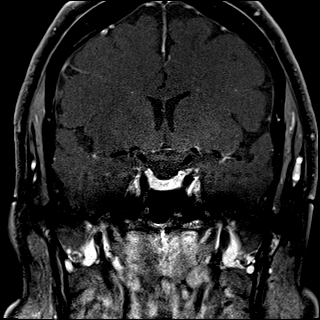
[im 32/32]
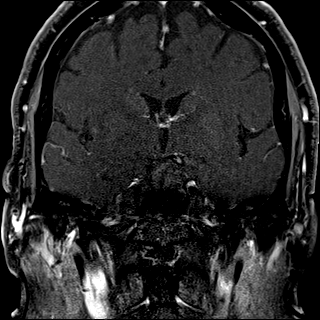

[19 of 48 positions shown; findings below may reference images not displayed]

FINDINGS: MRI HEAD FINDINGS

Brain:

There is no evidence of acute infarct.

No evidence of intracranial mass.

No midline shift or extra-axial fluid collection.

No chronic intracranial blood products.

Mild scattered T2/FLAIR hyperintensity within the cerebral white
matter is nonspecific, but consistent with chronic small vessel
ischemic disease.

Cerebral volume is normal for age.

No abnormal intracranial enhancement.

Vascular: Flow voids maintained within the proximal large arterial
vessels.

Skull and upper cervical spine: No focal marrow lesion.

MRI ORBITS FINDINGS

Orbits: The globes are normal in size and contour. The extraocular
muscles and optic nerve sheath complexes are symmetric and
unremarkable. No intraorbital mass or abnormal intraorbital
enhancement is demonstrated.

Visualized sinuses: Trace ethmoid sinus mucosal thickening. No
significant mastoid effusion.

Soft tissues: The visualized maxillofacial soft tissues are
unremarkable.
IMPRESSION: MRI head:

1. No evidence of acute intracranial abnormality.
2. Mild scattered T2 hyperintense signal changes within the cerebral
white matter are nonspecific, but most commonly seen on the basis of
chronic small vessel ischemia.

MRI orbits:

Unremarkable MRI of the orbits. No MR imaging evidence of optic
neuritis.

## 2021-08-06 DIAGNOSIS — M545 Low back pain, unspecified: Secondary | ICD-10-CM | POA: Diagnosis not present

## 2021-08-06 DIAGNOSIS — E7800 Pure hypercholesterolemia, unspecified: Secondary | ICD-10-CM | POA: Diagnosis not present

## 2021-08-06 DIAGNOSIS — I10 Essential (primary) hypertension: Secondary | ICD-10-CM | POA: Diagnosis not present

## 2021-08-06 DIAGNOSIS — K2100 Gastro-esophageal reflux disease with esophagitis, without bleeding: Secondary | ICD-10-CM | POA: Diagnosis not present

## 2021-09-24 DIAGNOSIS — M542 Cervicalgia: Secondary | ICD-10-CM | POA: Diagnosis not present

## 2021-09-24 DIAGNOSIS — Z299 Encounter for prophylactic measures, unspecified: Secondary | ICD-10-CM | POA: Diagnosis not present

## 2021-09-24 DIAGNOSIS — I10 Essential (primary) hypertension: Secondary | ICD-10-CM | POA: Diagnosis not present

## 2021-09-24 DIAGNOSIS — Z6829 Body mass index (BMI) 29.0-29.9, adult: Secondary | ICD-10-CM | POA: Diagnosis not present

## 2021-09-24 DIAGNOSIS — Z713 Dietary counseling and surveillance: Secondary | ICD-10-CM | POA: Diagnosis not present

## 2021-10-05 DIAGNOSIS — M159 Polyosteoarthritis, unspecified: Secondary | ICD-10-CM | POA: Diagnosis not present

## 2021-10-05 DIAGNOSIS — R159 Full incontinence of feces: Secondary | ICD-10-CM | POA: Diagnosis not present

## 2021-10-05 DIAGNOSIS — M25579 Pain in unspecified ankle and joints of unspecified foot: Secondary | ICD-10-CM | POA: Diagnosis not present

## 2021-11-06 DIAGNOSIS — M25579 Pain in unspecified ankle and joints of unspecified foot: Secondary | ICD-10-CM | POA: Diagnosis not present

## 2021-11-06 DIAGNOSIS — M159 Polyosteoarthritis, unspecified: Secondary | ICD-10-CM | POA: Diagnosis not present

## 2021-11-06 DIAGNOSIS — R159 Full incontinence of feces: Secondary | ICD-10-CM | POA: Diagnosis not present

## 2021-12-24 DIAGNOSIS — Z299 Encounter for prophylactic measures, unspecified: Secondary | ICD-10-CM | POA: Diagnosis not present

## 2021-12-24 DIAGNOSIS — M549 Dorsalgia, unspecified: Secondary | ICD-10-CM | POA: Diagnosis not present

## 2021-12-24 DIAGNOSIS — Z789 Other specified health status: Secondary | ICD-10-CM | POA: Diagnosis not present

## 2021-12-24 DIAGNOSIS — I10 Essential (primary) hypertension: Secondary | ICD-10-CM | POA: Diagnosis not present

## 2021-12-24 DIAGNOSIS — Z6829 Body mass index (BMI) 29.0-29.9, adult: Secondary | ICD-10-CM | POA: Diagnosis not present

## 2022-01-29 DIAGNOSIS — I10 Essential (primary) hypertension: Secondary | ICD-10-CM | POA: Diagnosis not present

## 2022-01-29 DIAGNOSIS — L089 Local infection of the skin and subcutaneous tissue, unspecified: Secondary | ICD-10-CM | POA: Diagnosis not present

## 2022-01-29 DIAGNOSIS — Z299 Encounter for prophylactic measures, unspecified: Secondary | ICD-10-CM | POA: Diagnosis not present

## 2022-01-29 DIAGNOSIS — Z87891 Personal history of nicotine dependence: Secondary | ICD-10-CM | POA: Diagnosis not present

## 2022-01-29 DIAGNOSIS — T07XXXA Unspecified multiple injuries, initial encounter: Secondary | ICD-10-CM | POA: Diagnosis not present

## 2022-01-29 DIAGNOSIS — W57XXXA Bitten or stung by nonvenomous insect and other nonvenomous arthropods, initial encounter: Secondary | ICD-10-CM | POA: Diagnosis not present

## 2022-06-03 DIAGNOSIS — H25813 Combined forms of age-related cataract, bilateral: Secondary | ICD-10-CM | POA: Diagnosis not present

## 2022-06-03 DIAGNOSIS — H53031 Strabismic amblyopia, right eye: Secondary | ICD-10-CM | POA: Diagnosis not present

## 2022-06-03 DIAGNOSIS — H524 Presbyopia: Secondary | ICD-10-CM | POA: Diagnosis not present

## 2022-06-03 DIAGNOSIS — H401131 Primary open-angle glaucoma, bilateral, mild stage: Secondary | ICD-10-CM | POA: Diagnosis not present

## 2022-06-06 DIAGNOSIS — K219 Gastro-esophageal reflux disease without esophagitis: Secondary | ICD-10-CM | POA: Diagnosis not present

## 2022-06-06 DIAGNOSIS — I10 Essential (primary) hypertension: Secondary | ICD-10-CM | POA: Diagnosis not present

## 2022-06-24 DIAGNOSIS — R5383 Other fatigue: Secondary | ICD-10-CM | POA: Diagnosis not present

## 2022-06-24 DIAGNOSIS — E7800 Pure hypercholesterolemia, unspecified: Secondary | ICD-10-CM | POA: Diagnosis not present

## 2022-06-24 DIAGNOSIS — Z23 Encounter for immunization: Secondary | ICD-10-CM | POA: Diagnosis not present

## 2022-06-24 DIAGNOSIS — Z299 Encounter for prophylactic measures, unspecified: Secondary | ICD-10-CM | POA: Diagnosis not present

## 2022-06-24 DIAGNOSIS — Z7189 Other specified counseling: Secondary | ICD-10-CM | POA: Diagnosis not present

## 2022-06-24 DIAGNOSIS — Z0000 Encounter for general adult medical examination without abnormal findings: Secondary | ICD-10-CM | POA: Diagnosis not present

## 2022-06-24 DIAGNOSIS — Z79899 Other long term (current) drug therapy: Secondary | ICD-10-CM | POA: Diagnosis not present

## 2022-06-24 DIAGNOSIS — Z1331 Encounter for screening for depression: Secondary | ICD-10-CM | POA: Diagnosis not present

## 2022-06-24 DIAGNOSIS — Z1339 Encounter for screening examination for other mental health and behavioral disorders: Secondary | ICD-10-CM | POA: Diagnosis not present

## 2022-06-24 DIAGNOSIS — E559 Vitamin D deficiency, unspecified: Secondary | ICD-10-CM | POA: Diagnosis not present

## 2022-08-16 DIAGNOSIS — I10 Essential (primary) hypertension: Secondary | ICD-10-CM | POA: Diagnosis not present

## 2022-08-16 DIAGNOSIS — Z6830 Body mass index (BMI) 30.0-30.9, adult: Secondary | ICD-10-CM | POA: Diagnosis not present

## 2022-08-16 DIAGNOSIS — Z8249 Family history of ischemic heart disease and other diseases of the circulatory system: Secondary | ICD-10-CM | POA: Diagnosis not present

## 2022-08-16 DIAGNOSIS — M1990 Unspecified osteoarthritis, unspecified site: Secondary | ICD-10-CM | POA: Diagnosis not present

## 2022-08-16 DIAGNOSIS — H409 Unspecified glaucoma: Secondary | ICD-10-CM | POA: Diagnosis not present

## 2022-08-16 DIAGNOSIS — Z825 Family history of asthma and other chronic lower respiratory diseases: Secondary | ICD-10-CM | POA: Diagnosis not present

## 2022-08-16 DIAGNOSIS — E669 Obesity, unspecified: Secondary | ICD-10-CM | POA: Diagnosis not present

## 2022-08-16 DIAGNOSIS — Z87891 Personal history of nicotine dependence: Secondary | ICD-10-CM | POA: Diagnosis not present

## 2022-08-16 DIAGNOSIS — Z809 Family history of malignant neoplasm, unspecified: Secondary | ICD-10-CM | POA: Diagnosis not present

## 2022-08-21 DIAGNOSIS — K219 Gastro-esophageal reflux disease without esophagitis: Secondary | ICD-10-CM | POA: Diagnosis not present

## 2022-08-21 DIAGNOSIS — Z6829 Body mass index (BMI) 29.0-29.9, adult: Secondary | ICD-10-CM | POA: Diagnosis not present

## 2022-08-21 DIAGNOSIS — Z299 Encounter for prophylactic measures, unspecified: Secondary | ICD-10-CM | POA: Diagnosis not present

## 2022-08-21 DIAGNOSIS — Z0000 Encounter for general adult medical examination without abnormal findings: Secondary | ICD-10-CM | POA: Diagnosis not present

## 2022-08-21 DIAGNOSIS — I10 Essential (primary) hypertension: Secondary | ICD-10-CM | POA: Diagnosis not present

## 2022-09-27 DIAGNOSIS — H401131 Primary open-angle glaucoma, bilateral, mild stage: Secondary | ICD-10-CM | POA: Diagnosis not present

## 2022-12-23 DIAGNOSIS — Z299 Encounter for prophylactic measures, unspecified: Secondary | ICD-10-CM | POA: Diagnosis not present

## 2022-12-23 DIAGNOSIS — Z0000 Encounter for general adult medical examination without abnormal findings: Secondary | ICD-10-CM | POA: Diagnosis not present

## 2022-12-23 DIAGNOSIS — I10 Essential (primary) hypertension: Secondary | ICD-10-CM | POA: Diagnosis not present

## 2022-12-23 DIAGNOSIS — Z6830 Body mass index (BMI) 30.0-30.9, adult: Secondary | ICD-10-CM | POA: Diagnosis not present

## 2023-05-19 DIAGNOSIS — S82892A Other fracture of left lower leg, initial encounter for closed fracture: Secondary | ICD-10-CM | POA: Diagnosis not present

## 2023-05-19 DIAGNOSIS — M25572 Pain in left ankle and joints of left foot: Secondary | ICD-10-CM | POA: Diagnosis not present

## 2023-05-19 DIAGNOSIS — Z6829 Body mass index (BMI) 29.0-29.9, adult: Secondary | ICD-10-CM | POA: Diagnosis not present

## 2023-05-19 DIAGNOSIS — E663 Overweight: Secondary | ICD-10-CM | POA: Diagnosis not present

## 2023-05-22 DIAGNOSIS — Z299 Encounter for prophylactic measures, unspecified: Secondary | ICD-10-CM | POA: Diagnosis not present

## 2023-05-22 DIAGNOSIS — I10 Essential (primary) hypertension: Secondary | ICD-10-CM | POA: Diagnosis not present

## 2023-05-22 DIAGNOSIS — M25572 Pain in left ankle and joints of left foot: Secondary | ICD-10-CM | POA: Diagnosis not present

## 2023-05-29 DIAGNOSIS — M25572 Pain in left ankle and joints of left foot: Secondary | ICD-10-CM | POA: Diagnosis not present

## 2023-05-29 DIAGNOSIS — M76822 Posterior tibial tendinitis, left leg: Secondary | ICD-10-CM | POA: Diagnosis not present

## 2023-06-05 DIAGNOSIS — Z008 Encounter for other general examination: Secondary | ICD-10-CM | POA: Diagnosis not present

## 2023-06-05 DIAGNOSIS — M1990 Unspecified osteoarthritis, unspecified site: Secondary | ICD-10-CM | POA: Diagnosis not present

## 2023-06-05 DIAGNOSIS — I10 Essential (primary) hypertension: Secondary | ICD-10-CM | POA: Diagnosis not present

## 2023-06-05 DIAGNOSIS — Z87891 Personal history of nicotine dependence: Secondary | ICD-10-CM | POA: Diagnosis not present

## 2023-06-05 DIAGNOSIS — Z8249 Family history of ischemic heart disease and other diseases of the circulatory system: Secondary | ICD-10-CM | POA: Diagnosis not present

## 2023-06-05 DIAGNOSIS — H409 Unspecified glaucoma: Secondary | ICD-10-CM | POA: Diagnosis not present

## 2023-06-05 DIAGNOSIS — Z809 Family history of malignant neoplasm, unspecified: Secondary | ICD-10-CM | POA: Diagnosis not present

## 2023-06-25 DIAGNOSIS — R519 Headache, unspecified: Secondary | ICD-10-CM | POA: Diagnosis not present

## 2023-06-25 DIAGNOSIS — I10 Essential (primary) hypertension: Secondary | ICD-10-CM | POA: Diagnosis not present

## 2023-06-25 DIAGNOSIS — R609 Edema, unspecified: Secondary | ICD-10-CM | POA: Diagnosis not present

## 2023-06-25 DIAGNOSIS — Z299 Encounter for prophylactic measures, unspecified: Secondary | ICD-10-CM | POA: Diagnosis not present

## 2023-07-08 DIAGNOSIS — E559 Vitamin D deficiency, unspecified: Secondary | ICD-10-CM | POA: Diagnosis not present

## 2023-07-08 DIAGNOSIS — R5383 Other fatigue: Secondary | ICD-10-CM | POA: Diagnosis not present

## 2023-07-08 DIAGNOSIS — Z79899 Other long term (current) drug therapy: Secondary | ICD-10-CM | POA: Diagnosis not present

## 2023-07-08 DIAGNOSIS — E7800 Pure hypercholesterolemia, unspecified: Secondary | ICD-10-CM | POA: Diagnosis not present

## 2024-03-11 DIAGNOSIS — H401111 Primary open-angle glaucoma, right eye, mild stage: Secondary | ICD-10-CM | POA: Diagnosis not present

## 2024-07-13 DIAGNOSIS — E7800 Pure hypercholesterolemia, unspecified: Secondary | ICD-10-CM | POA: Diagnosis not present

## 2024-07-13 DIAGNOSIS — M1712 Unilateral primary osteoarthritis, left knee: Secondary | ICD-10-CM | POA: Diagnosis not present

## 2024-07-13 DIAGNOSIS — R52 Pain, unspecified: Secondary | ICD-10-CM | POA: Diagnosis not present

## 2024-07-13 DIAGNOSIS — M25462 Effusion, left knee: Secondary | ICD-10-CM | POA: Diagnosis not present

## 2024-07-13 DIAGNOSIS — M25562 Pain in left knee: Secondary | ICD-10-CM | POA: Diagnosis not present

## 2024-07-13 DIAGNOSIS — R5383 Other fatigue: Secondary | ICD-10-CM | POA: Diagnosis not present

## 2024-07-13 DIAGNOSIS — Z79899 Other long term (current) drug therapy: Secondary | ICD-10-CM | POA: Diagnosis not present

## 2024-07-13 DIAGNOSIS — E559 Vitamin D deficiency, unspecified: Secondary | ICD-10-CM | POA: Diagnosis not present

## 2024-07-23 DIAGNOSIS — E2839 Other primary ovarian failure: Secondary | ICD-10-CM | POA: Diagnosis not present

## 2024-08-03 DIAGNOSIS — H401121 Primary open-angle glaucoma, left eye, mild stage: Secondary | ICD-10-CM | POA: Diagnosis not present
